# Patient Record
Sex: Female | Born: 2005 | Race: Black or African American | Hispanic: No | Marital: Single
Health system: Southern US, Community
[De-identification: ages and names within clinical notes are randomized; demographics above are authoritative.]

---
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
## 2024-01-07 ENCOUNTER — Ambulatory Visit: Admitting: Nurse Practitioner

## 2024-03-03 ENCOUNTER — Ambulatory Visit: Admitting: Nurse Practitioner

## 2024-04-28 ENCOUNTER — Ambulatory Visit: Payer: Self-pay | Admitting: Nurse Practitioner
